# Patient Record
Sex: Female | Born: 2000 | Race: Black or African American | Hispanic: No | Marital: Single | Smoking: Never smoker
Health system: Southern US, Community
[De-identification: ages and names within clinical notes are randomized; demographics above are authoritative.]

## PROBLEM LIST (undated history)

## (undated) ENCOUNTER — Inpatient Hospital Stay (HOSPITAL_COMMUNITY): Payer: Self-pay

## (undated) DIAGNOSIS — S92919A Unspecified fracture of unspecified toe(s), initial encounter for closed fracture: Secondary | ICD-10-CM

## (undated) HISTORY — PX: MOUTH SURGERY: SHX715

---
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
## 2021-12-20 ENCOUNTER — Emergency Department (HOSPITAL_COMMUNITY)
Admission: EM | Admit: 2021-12-20 | Discharge: 2021-12-20 | Discharge status: Against Medical Advice | Payer: Medicaid Other | Attending: Student | Admitting: Student

## 2021-12-20 ENCOUNTER — Encounter (HOSPITAL_COMMUNITY): Payer: Self-pay | Admitting: Emergency Medicine

## 2021-12-20 ENCOUNTER — Other Ambulatory Visit: Payer: Self-pay

## 2021-12-20 DIAGNOSIS — R1110 Vomiting, unspecified: Secondary | ICD-10-CM | POA: Insufficient documentation

## 2021-12-20 DIAGNOSIS — Z5321 Procedure and treatment not carried out due to patient leaving prior to being seen by health care provider: Secondary | ICD-10-CM | POA: Diagnosis not present

## 2021-12-20 LAB — COMPREHENSIVE METABOLIC PANEL
ALT: 31 U/L (ref 0–44)
AST: 25 U/L (ref 15–41)
Albumin: 3.3 g/dL — ABNORMAL LOW (ref 3.5–5.0)
Alkaline Phosphatase: 44 U/L (ref 38–126)
Anion gap: 11 (ref 5–15)
BUN: 7 mg/dL (ref 6–20)
CO2: 27 mmol/L (ref 22–32)
Calcium: 9.3 mg/dL (ref 8.9–10.3)
Chloride: 103 mmol/L (ref 98–111)
Creatinine, Ser: 0.76 mg/dL (ref 0.44–1.00)
GFR, Estimated: >60 mL/min (ref >60)
Glucose, Bld: 100 mg/dL — ABNORMAL HIGH (ref 70–99)
Potassium: 3.8 mmol/L (ref 3.5–5.1)
Sodium: 141 mmol/L (ref 135–145)
Total Bilirubin: 0.3 mg/dL (ref 0.3–1.2)
Total Protein: 6 g/dL — ABNORMAL LOW (ref 6.5–8.1)

## 2021-12-20 LAB — RAPID URINE DRUG SCREEN, HOSP PERFORMED
Amphetamines: NOT DETECTED
Barbiturates: NOT DETECTED
Benzodiazepines: NOT DETECTED
Cocaine: POSITIVE — AB
Opiates: POSITIVE — AB
Tetrahydrocannabinol: POSITIVE — AB

## 2021-12-20 LAB — URINALYSIS, ROUTINE W REFLEX MICROSCOPIC
Bilirubin Urine: NEGATIVE
Glucose, UA: NEGATIVE mg/dL
Hgb urine dipstick: NEGATIVE
Ketones, ur: NEGATIVE mg/dL
Nitrite: NEGATIVE
Protein, ur: NEGATIVE mg/dL
Specific Gravity, Urine: 1.014 (ref 1.005–1.030)
WBC, UA: >50 WBC/hpf — ABNORMAL HIGH (ref 0–5)
pH: 8 (ref 5.0–8.0)

## 2021-12-20 LAB — CBC WITH DIFFERENTIAL/PLATELET
Abs Immature Granulocytes: 0.07 10*3/uL (ref 0.00–0.07)
Basophils Absolute: 0 10*3/uL (ref 0.0–0.1)
Basophils Relative: 0 %
Eosinophils Absolute: 0.1 10*3/uL (ref 0.0–0.5)
Eosinophils Relative: 1 %
HCT: 46.8 % — ABNORMAL HIGH (ref 36.0–46.0)
Hemoglobin: 15.7 g/dL — ABNORMAL HIGH (ref 12.0–15.0)
Immature Granulocytes: 1 %
Lymphocytes Relative: 14 %
Lymphs Abs: 1.8 10*3/uL (ref 0.7–4.0)
MCH: 31.7 pg (ref 26.0–34.0)
MCHC: 33.5 g/dL (ref 30.0–36.0)
MCV: 94.5 fL (ref 80.0–100.0)
Monocytes Absolute: 0.6 10*3/uL (ref 0.1–1.0)
Monocytes Relative: 5 %
Neutro Abs: 10.4 10*3/uL — ABNORMAL HIGH (ref 1.7–7.7)
Neutrophils Relative %: 79 %
Platelets: 217 10*3/uL (ref 150–400)
RBC: 4.95 MIL/uL (ref 3.87–5.11)
RDW: 12.9 % (ref 11.5–15.5)
WBC: 12.9 10*3/uL — ABNORMAL HIGH (ref 4.0–10.5)
nRBC: 0 % (ref 0.0–0.2)

## 2021-12-20 LAB — I-STAT BETA HCG BLOOD, ED (MC, WL, AP ONLY): I-stat hCG, quantitative: <5 m[IU]/mL (ref <5)

## 2021-12-20 LAB — LIPASE, BLOOD: Lipase: 48 U/L (ref 11–51)

## 2021-12-20 MED ORDER — ONDANSETRON 4 MG PO TBDP
8.0000 mg | ORAL_TABLET | Freq: Once | ORAL | Status: AC
  Administered 2021-12-20: 8 mg via ORAL
  Filled 2021-12-20: qty 2

## 2021-12-20 NOTE — ED Provider Triage Note (Signed)
Emergency Medicine Provider Triage Evaluation Note  Janice [name] , a 21 y.o. female  was evaluated in triage.  Pt complains of possible opiate withdrawal.  Patient last use snorting fentanyl 24 hours ago.  She has had 4 episodes of emesis today, denies any seizure-like activity.  No chest pain or shortness of breath..  Review of Systems  Per HPI  Physical Exam  BP 116/84 (BP Location: Right Arm)   Pulse 83   Temp 98.5 F (36.9 C) (Oral)   Resp 16   SpO2 100%  Gen:   Awake, no distress   Resp:  Normal effort  MSK:   Moves extremities without difficulty  Other:    Medical Decision Making  Medically screening exam initiated at 2:00 PM.  Appropriate orders placed.  Elwin Mocha was informed that the remainder of the evaluation will be completed by another provider, this initial triage assessment does not replace that evaluation, and the importance of remaining in the ED until their evaluation is complete.     Theron Arista, PA-C 12/20/21 1400

## 2021-12-20 NOTE — ED Triage Notes (Signed)
Pt reports having withdrawals from fentayl. LAst use yesterday. Pt reports abdominal pain, nausea, vomiting, bodyaches. States needs help getting off drugs, has been using for the last 3 months.

## 2021-12-20 NOTE — ED Notes (Signed)
No reply for vitals or re-assess. Pt labels found in empty chair. Family missing as well.

## 2021-12-20 NOTE — ED Notes (Signed)
Re-assessment attempted. Pt not seen in the lobby at this time. Family also not seen.

## 2021-12-21 ENCOUNTER — Encounter (HOSPITAL_COMMUNITY): Payer: Self-pay | Admitting: Emergency Medicine

## 2021-12-21 ENCOUNTER — Emergency Department (HOSPITAL_COMMUNITY)
Admission: EM | Admit: 2021-12-21 | Discharge: 2021-12-21 | Disposition: A | Discharge status: Home / Self Care | Payer: Medicaid Other | Attending: Emergency Medicine | Admitting: Emergency Medicine

## 2021-12-21 DIAGNOSIS — F1193 Opioid use, unspecified with withdrawal: Secondary | ICD-10-CM | POA: Diagnosis present

## 2021-12-21 MED ORDER — HYDROXYZINE HCL 25 MG PO TABS: 25.0000 mg | ORAL_TABLET | Freq: Four times a day (QID) | ORAL | 0 refills | Status: AC

## 2021-12-21 MED ORDER — ONDANSETRON 4 MG PO TBDP: ORAL_TABLET | ORAL | 0 refills | Status: AC

## 2021-12-21 MED ORDER — ONDANSETRON 4 MG PO TBDP
4.0000 mg | ORAL_TABLET | Freq: Once | ORAL | Status: AC
  Administered 2021-12-21: 4 mg via ORAL
  Filled 2021-12-21: qty 1

## 2021-12-21 MED ORDER — HYDROXYZINE HCL 25 MG PO TABS
25.0000 mg | ORAL_TABLET | Freq: Once | ORAL | Status: AC
  Administered 2021-12-21: 25 mg via ORAL
  Filled 2021-12-21: qty 1

## 2021-12-21 NOTE — ED Provider Notes (Signed)
[facility] EMERGENCY DEPARTMENT Provider Note   CSN: [id number] Arrival date & time: 12/21/21  1115     History  Chief Complaint  Patient presents with   Withdrawal    Janice [name] is a 21 y.o. 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  Patient has been using fentanyl and also somebody else's Percocet for the last 2 months.  Patient states that she decided to quit and last use was 2 days ago.  She came in yesterday because she started feeling something crawling on her skin and started becoming nauseated.  She was given a dose of Zofran but did not wait to be seen so left after being triaged.  Patient states that her symptoms returned.  She was vomiting today and felt things crawling on her skin.  Denies any further fentanyl use.  Patient also uses marijuana at baseline.  Denies any thoughts of harming herself or others  The history is provided by the patient.       Home Medications Prior to Admission medications   Medication Sig Start Date End Date Taking? Authorizing Provider  ibuprofen (ADVIL,MOTRIN) 600 MG tablet Take 1 tablet (600 mg total) by mouth every 6 (six) hours as needed. 03/31/18   Rancour, Jeannett Senior, MD  Pediatric Multivit-Minerals-C (MULTIVITAMIN GUMMIES CHILDRENS PO) Take 2 tablets by mouth daily.    [provider]      Allergies    Patient has no known allergies.    Review of Systems   Review of Systems  Gastrointestinal:  Positive for vomiting.  Neurological:  Positive for dizziness.  All other systems reviewed and are negative.   Physical Exam Updated Vital Signs BP 106/70   Pulse 85   Temp 98.3 F (36.8 C)   Resp 16   LMP 11/20/2021   SpO2 98%  Physical Exam Vitals and nursing note reviewed.  HENT:     Head: Normocephalic.     Nose: Nose normal.     Mouth/Throat:     Mouth: Mucous membranes are moist.  Eyes:     Extraocular Movements: Extraocular movements intact.     Pupils:  Pupils are equal, round, and reactive to light.  Cardiovascular:     Rate and Rhythm: Normal rate and regular rhythm.     Pulses: Normal pulses.     Heart sounds: Normal heart sounds.  Pulmonary:     Effort: Pulmonary effort is normal.     Breath sounds: Normal breath sounds.  Abdominal:     General: Abdomen is flat.     Palpations: Abdomen is soft.  Musculoskeletal:        General: Normal range of motion.     Cervical back: Normal range of motion and neck supple.  Skin:    General: Skin is warm.     Capillary Refill: Capillary refill takes less than 2 seconds.  Neurological:     General: No focal deficit present.     Mental Status: She is alert and oriented to person, place, and time.  Psychiatric:        Mood and Affect: Mood normal.        Behavior: Behavior normal.     ED Results / Procedures / Treatments   Labs (all labs ordered are listed, but only abnormal results are displayed) Labs Reviewed - No data to display  EKG None  Radiology No results found.  Procedures Procedures    Medications Ordered in ED Medications  ondansetron (ZOFRAN-ODT)  disintegrating tablet 4 mg (has no administration in time range)  hydrOXYzine (ATARAX) tablet 25 mg (has no administration in time range)    ED Course/ Medical Decision Making/ A&P                           Medical Decision Making Janice [name] is a 21 y.o. female here presenting with dizziness and also vomiting and felt something crawling on her skin.  Patient last used fentanyl 2 days ago.  I think she likely has mild fentanyl withdrawal.  Patient is not tachycardic.  Patient has no hallucinations or thoughts of harming herself or others.  At this point, patient is stable for discharge and she can go home with substance abuse resources as well as Zofran and hydroxyzine.   Problems Addressed: Opiate withdrawal [facility] acute illness or injury  Risk Prescription drug management.    Final Clinical Impression(s)  / ED Diagnoses Final diagnoses:  None    Rx / DC Orders ED Discharge Orders     None         Charlynne Pander, MD 12/21/21 2122

## 2021-12-21 NOTE — ED Triage Notes (Signed)
Patient here with complaint of feeling like she may be withdrawing from fentanyl, last use two days ago. Patient here yesterday for same. Patient is alert and in no apparent distress at this time, complains of feeling "hot flashes" and nauseous.

## 2021-12-21 NOTE — ED Provider Triage Note (Signed)
Emergency Medicine Provider Triage Evaluation Note  Janice [name] , a 21 y.o. female  was evaluated in triage.  Pt complains of of opiate withdrawal symptoms.  Patient states that she last used 2 days ago.  She was here in the emergency department yesterday and was given Zofran in triage.  She after that without being seen as she was feeling better after the Zofran.  However, her symptoms are continued to worsen.  She endorses a skin crawling sensation, severe nausea and diaphoresis.  Denies chest pain, shortness of breath, diarrhea.  She states that she did not use after she left yesterday.  Review of Systems  Positive:  Negative:   Physical Exam  BP 109/71   Pulse (!) 106   Temp 98.6 F (37 C) (Oral)   Resp 16   LMP 11/20/2021   SpO2 100%  Gen:   Awake, no distress   Resp:  Normal effort  MSK:   Moves extremities without difficulty  Other:    Medical Decision Making  Medically screening exam initiated at 11:56 AM.  Appropriate orders placed.  Elwin Mocha was informed that the remainder of the evaluation will be completed by another provider, this initial triage assessment does not replace that evaluation, and the importance of remaining in the ED until their evaluation is complete.     Janell Quiet, [place] 12/21/21 1157

## 2021-12-21 NOTE — Discharge Instructions (Signed)
I have prescribed some Zofran for nausea.  You can also take hydroxyzine for anxiety and dizziness  See your doctor for follow-up.  I recommend that you go and seek help regarding your opiate use  Return to ER if you have thoughts of harming yourself or others, vomiting, dizziness

## 2022-01-27 ENCOUNTER — Encounter (HOSPITAL_COMMUNITY): Payer: Self-pay | Admitting: Emergency Medicine

## 2022-01-27 ENCOUNTER — Other Ambulatory Visit: Payer: Self-pay

## 2022-01-27 ENCOUNTER — Emergency Department (HOSPITAL_COMMUNITY)
Admission: EM | Admit: 2022-01-27 | Discharge: 2022-01-27 | Discharge status: Against Medical Advice | Payer: Medicaid Other | Attending: Emergency Medicine | Admitting: Emergency Medicine

## 2022-01-27 DIAGNOSIS — T405X1A Poisoning by cocaine, accidental (unintentional), initial encounter: Secondary | ICD-10-CM | POA: Insufficient documentation

## 2022-01-27 DIAGNOSIS — Y900 Blood alcohol level of less than 20 mg/100 ml: Secondary | ICD-10-CM | POA: Insufficient documentation

## 2022-01-27 DIAGNOSIS — Z5321 Procedure and treatment not carried out due to patient leaving prior to being seen by health care provider: Secondary | ICD-10-CM | POA: Diagnosis not present

## 2022-01-27 LAB — CBC WITH DIFFERENTIAL/PLATELET
Abs Immature Granulocytes: 0.01 10*3/uL (ref 0.00–0.07)
Basophils Absolute: 0 10*3/uL (ref 0.0–0.1)
Basophils Relative: 0 %
Eosinophils Absolute: 0.1 10*3/uL (ref 0.0–0.5)
Eosinophils Relative: 1 %
HCT: 41 % (ref 36.0–46.0)
Hemoglobin: 13.7 g/dL (ref 12.0–15.0)
Immature Granulocytes: 0 %
Lymphocytes Relative: 45 %
Lymphs Abs: 3.1 10*3/uL (ref 0.7–4.0)
MCH: 31.6 pg (ref 26.0–34.0)
MCHC: 33.4 g/dL (ref 30.0–36.0)
MCV: 94.7 fL (ref 80.0–100.0)
Monocytes Absolute: 0.6 10*3/uL (ref 0.1–1.0)
Monocytes Relative: 8 %
Neutro Abs: 3.2 10*3/uL (ref 1.7–7.7)
Neutrophils Relative %: 46 %
Platelets: 373 10*3/uL (ref 150–400)
RBC: 4.33 MIL/uL (ref 3.87–5.11)
RDW: 12.7 % (ref 11.5–15.5)
WBC: 6.9 10*3/uL (ref 4.0–10.5)
nRBC: 0 % (ref 0.0–0.2)

## 2022-01-27 LAB — COMPREHENSIVE METABOLIC PANEL
ALT: 23 U/L (ref 0–44)
AST: 29 U/L (ref 15–41)
Albumin: 4.4 g/dL (ref 3.5–5.0)
Alkaline Phosphatase: 55 U/L (ref 38–126)
Anion gap: 17 — ABNORMAL HIGH (ref 5–15)
BUN: 7 mg/dL (ref 6–20)
CO2: 18 mmol/L — ABNORMAL LOW (ref 22–32)
Calcium: 10 mg/dL (ref 8.9–10.3)
Chloride: 103 mmol/L (ref 98–111)
Creatinine, Ser: 0.97 mg/dL (ref 0.44–1.00)
GFR, Estimated: >60 mL/min (ref >60)
Glucose, Bld: 120 mg/dL — ABNORMAL HIGH (ref 70–99)
Potassium: 2.5 mmol/L — CL (ref 3.5–5.1)
Sodium: 138 mmol/L (ref 135–145)
Total Bilirubin: 0.3 mg/dL (ref 0.3–1.2)
Total Protein: 7.7 g/dL (ref 6.5–8.1)

## 2022-01-27 LAB — I-STAT BETA HCG BLOOD, ED (MC, WL, AP ONLY): I-stat hCG, quantitative: <5 m[IU]/mL (ref <5)

## 2022-01-27 LAB — ETHANOL: Alcohol, Ethyl (B): <10 mg/dL (ref <10)

## 2022-01-27 LAB — SALICYLATE LEVEL: Salicylate Lvl: <7 mg/dL — ABNORMAL LOW (ref 7.0–30.0)

## 2022-01-27 LAB — ACETAMINOPHEN LEVEL: Acetaminophen (Tylenol), Serum: <10 ug/mL — ABNORMAL LOW (ref 10–30)

## 2022-01-27 NOTE — ED Triage Notes (Signed)
Patient reports smoked Marijuana ; Cocaine and probably Fentanyl this evening , denies SI , respirations unlabored .

## 2022-01-27 NOTE — ED Notes (Signed)
Triage RN put patient back in lobby. Patient states she is leaving to put her son to bed

## 2022-01-27 NOTE — ED Provider Triage Note (Addendum)
Emergency Medicine Provider Triage Evaluation Note  MARCELLE [name] , a 21 y.o. female  was evaluated in triage.  Pt complains of possible OD.  Admits to smoking marijuana and using cocaine earlier tonight, thinks marijuana might have been laced with fentanyl.  Hx of fentanyl OD 5 months ago, no known use since then.  Denies EtOH.  States she just feels "weird"  denies attempted self harm.  Review of Systems  Positive: OD Negative: SI/HI  Physical Exam  BP 111/77 (BP Location: Right Arm)   Pulse (!) 107   Temp 99.1 F (37.3 C)   Resp (!) 22   SpO2 100%   Gen:   Awake, appears under the influence Resp:  Normal effort  MSK:   Moves extremities without difficulty  Other:    Medical Decision Making  Medically screening exam initiated at 1:37 AM.  Appropriate orders placed.  Janice [name] was informed that the remainder of the evaluation will be completed by another provider, this initial triage assessment does not replace that evaluation, and the importance of remaining in the ED until their evaluation is complete.  OD-- admits to using THC and cocaine tonight, thinks may have been laced with fentanyl as well. Denies attempted self harm.  Will check labs, EKG.   Larene Pickett, PA-C 01/27/22 0139    Larene Pickett, PA-C 01/27/22 0139

## 2022-01-27 NOTE — ED Notes (Signed)
Patient kept in triage 3

## 2022-04-14 NOTE — Congregational Nurse Program (Signed)
Nurse met with client, her son and her mother this afternoon at the shelter.  They are here to be admitted to shelter and need Covid screening. Client denies having any medical conditions. She states that she had a diagnosis of ADHD but not currently on any medications. Client states that she moved here from the [place] area and was hoping to be able to move in with her mom not knowing that her mom was un housed.Her son Parks Neptune. Guion (12 29 2018) appears well.  He has a pediatrician and is up to date with immunizations.  Plan:  Covid test negative.  Client to let nurse know of any medical/mental health needs while at the shelter. Encouraged to get a PCP.  Ellysa Parrack D. Joneen Caraway MSN, Advice worker Nurse [employer] [phone]
